# Patient Record
Sex: Female | Born: 1985 | Race: Black or African American | Marital: Single | Smoking: Former smoker
Health system: Southern US, Community
[De-identification: ages and names within clinical notes are randomized; demographics above are authoritative.]

## PROBLEM LIST (undated history)

## (undated) HISTORY — PX: WISDOM TOOTH EXTRACTION: SHX21

---

## 2011-02-24 ENCOUNTER — Inpatient Hospital Stay (INDEPENDENT_AMBULATORY_CARE_PROVIDER_SITE_OTHER)
Admission: RE | Admit: 2011-02-24 | Discharge: 2011-02-24 | Disposition: A | Discharge status: Home / Self Care | Payer: BC Managed Care – PPO | Source: Ambulatory Visit | Attending: Family Medicine | Admitting: Family Medicine

## 2011-02-24 DIAGNOSIS — J020 Streptococcal pharyngitis: Secondary | ICD-10-CM

## 2011-02-24 LAB — POCT RAPID STREP A: Streptococcus, Group A Screen (Direct): POSITIVE — AB

## 2015-06-08 LAB — OB RESULTS CONSOLE HEPATITIS B SURFACE ANTIGEN: HEP B S AG: NEGATIVE

## 2015-06-08 LAB — OB RESULTS CONSOLE RPR: RPR: NONREACTIVE

## 2015-06-08 LAB — OB RESULTS CONSOLE GC/CHLAMYDIA
Chlamydia: NEGATIVE
Gonorrhea: NEGATIVE

## 2015-06-08 LAB — OB RESULTS CONSOLE HIV ANTIBODY (ROUTINE TESTING): HIV: NONREACTIVE

## 2015-06-08 LAB — OB RESULTS CONSOLE RUBELLA ANTIBODY, IGM: RUBELLA: IMMUNE

## 2015-09-11 LAB — OB RESULTS CONSOLE GBS: GBS: POSITIVE

## 2015-10-07 ENCOUNTER — Encounter (HOSPITAL_COMMUNITY): Payer: Self-pay | Admitting: *Deleted

## 2015-10-07 ENCOUNTER — Encounter (HOSPITAL_COMMUNITY)
Admission: AD | Disposition: A | Discharge status: Home / Self Care | Payer: Self-pay | Source: Ambulatory Visit | Attending: Obstetrics & Gynecology

## 2015-10-07 ENCOUNTER — Inpatient Hospital Stay (HOSPITAL_COMMUNITY): Payer: Medicaid Other | Admitting: Anesthesiology

## 2015-10-07 ENCOUNTER — Inpatient Hospital Stay (HOSPITAL_COMMUNITY): Payer: Medicaid Other

## 2015-10-07 ENCOUNTER — Inpatient Hospital Stay (HOSPITAL_COMMUNITY)
Admission: AD | Admit: 2015-10-07 | Discharge: 2015-10-10 | DRG: 766 | Disposition: A | Discharge status: Home / Self Care | Payer: Medicaid Other | Source: Ambulatory Visit | Attending: Obstetrics & Gynecology | Admitting: Obstetrics & Gynecology

## 2015-10-07 DIAGNOSIS — O9902 Anemia complicating childbirth: Secondary | ICD-10-CM | POA: Diagnosis present

## 2015-10-07 DIAGNOSIS — Z3A39 39 weeks gestation of pregnancy: Secondary | ICD-10-CM

## 2015-10-07 DIAGNOSIS — Z87891 Personal history of nicotine dependence: Secondary | ICD-10-CM

## 2015-10-07 DIAGNOSIS — D649 Anemia, unspecified: Secondary | ICD-10-CM | POA: Diagnosis present

## 2015-10-07 DIAGNOSIS — O99824 Streptococcus B carrier state complicating childbirth: Secondary | ICD-10-CM | POA: Diagnosis present

## 2015-10-07 DIAGNOSIS — O99019 Anemia complicating pregnancy, unspecified trimester: Secondary | ICD-10-CM | POA: Diagnosis present

## 2015-10-07 DIAGNOSIS — O43129 Velamentous insertion of umbilical cord, unspecified trimester: Secondary | ICD-10-CM | POA: Diagnosis present

## 2015-10-07 LAB — TYPE AND SCREEN
ABO/RH(D): AB POS
ANTIBODY SCREEN: NEGATIVE

## 2015-10-07 LAB — CBC
HEMATOCRIT: 35.4 % — AB (ref 36.0–46.0)
HEMOGLOBIN: 12.2 g/dL (ref 12.0–15.0)
MCH: 30.9 pg (ref 26.0–34.0)
MCHC: 34.5 g/dL (ref 30.0–36.0)
MCV: 89.6 fL (ref 78.0–100.0)
Platelets: 373 10*3/uL (ref 150–400)
RBC: 3.95 MIL/uL (ref 3.87–5.11)
RDW: 13.6 % (ref 11.5–15.5)
WBC: 16.5 10*3/uL — ABNORMAL HIGH (ref 4.0–10.5)

## 2015-10-07 LAB — ABO/RH: ABO/RH(D): AB POS

## 2015-10-07 SURGERY — Surgical Case
Anesthesia: Epidural

## 2015-10-07 MED ORDER — CITRIC ACID-SODIUM CITRATE 334-500 MG/5ML PO SOLN
30.0000 mL | ORAL | Status: DC | PRN
  Administered 2015-10-07: 30 mL via ORAL
  Filled 2015-10-07: qty 15

## 2015-10-07 MED ORDER — FENTANYL 2.5 MCG/ML BUPIVACAINE 1/10 % EPIDURAL INFUSION (WH - ANES)
14.0000 mL/h | INTRAMUSCULAR | Status: DC | PRN
  Administered 2015-10-07: 14 mL/h via EPIDURAL
  Filled 2015-10-07: qty 125

## 2015-10-07 MED ORDER — PHENYLEPHRINE 40 MCG/ML (10ML) SYRINGE FOR IV PUSH (FOR BLOOD PRESSURE SUPPORT)
80.0000 ug | PREFILLED_SYRINGE | INTRAVENOUS | Status: DC | PRN
  Filled 2015-10-07: qty 20

## 2015-10-07 MED ORDER — OXYTOCIN 10 UNIT/ML IJ SOLN
INTRAMUSCULAR | Status: AC
  Filled 2015-10-07: qty 4

## 2015-10-07 MED ORDER — DIPHENHYDRAMINE HCL 50 MG/ML IJ SOLN: 12.5000 mg | INTRAMUSCULAR | Status: DC | PRN

## 2015-10-07 MED ORDER — SCOPOLAMINE 1 MG/3DAYS TD PT72
MEDICATED_PATCH | TRANSDERMAL | Status: AC
  Filled 2015-10-07: qty 1

## 2015-10-07 MED ORDER — OXYTOCIN 40 UNITS IN LACTATED RINGERS INFUSION - SIMPLE MED
1.0000 m[IU]/min | INTRAVENOUS | Status: DC
  Administered 2015-10-07: 1 m[IU]/min via INTRAVENOUS

## 2015-10-07 MED ORDER — SCOPOLAMINE 1 MG/3DAYS TD PT72
MEDICATED_PATCH | TRANSDERMAL | Status: DC | PRN
  Administered 2015-10-07: 1 via TRANSDERMAL

## 2015-10-07 MED ORDER — OXYTOCIN 40 UNITS IN LACTATED RINGERS INFUSION - SIMPLE MED
INTRAVENOUS | Status: AC
  Filled 2015-10-07: qty 1000

## 2015-10-07 MED ORDER — MORPHINE SULFATE (PF) 0.5 MG/ML IJ SOLN
INTRAMUSCULAR | Status: AC
  Filled 2015-10-07: qty 10

## 2015-10-07 MED ORDER — PHENYLEPHRINE HCL 10 MG/ML IJ SOLN
INTRAMUSCULAR | Status: DC | PRN
  Administered 2015-10-07 (×2): 80 ug via INTRAVENOUS
  Administered 2015-10-07: 40 ug via INTRAVENOUS
  Administered 2015-10-07: 80 ug via INTRAVENOUS

## 2015-10-07 MED ORDER — PENICILLIN G POTASSIUM 5000000 UNITS IJ SOLR
2.5000 10*6.[IU] | INTRAVENOUS | Status: DC
  Administered 2015-10-07: 2.5 10*6.[IU] via INTRAVENOUS
  Filled 2015-10-07 (×3): qty 2.5

## 2015-10-07 MED ORDER — ACETAMINOPHEN 325 MG PO TABS: 650.0000 mg | ORAL_TABLET | ORAL | Status: DC | PRN

## 2015-10-07 MED ORDER — EPHEDRINE 5 MG/ML INJ: 10.0000 mg | INTRAVENOUS | Status: DC | PRN

## 2015-10-07 MED ORDER — OXYCODONE-ACETAMINOPHEN 5-325 MG PO TABS: 2.0000 | ORAL_TABLET | ORAL | Status: DC | PRN

## 2015-10-07 MED ORDER — ONDANSETRON HCL 4 MG/2ML IJ SOLN: 4.0000 mg | Freq: Four times a day (QID) | INTRAMUSCULAR | Status: DC | PRN

## 2015-10-07 MED ORDER — MEPERIDINE HCL 25 MG/ML IJ SOLN
INTRAMUSCULAR | Status: DC | PRN
  Administered 2015-10-07 (×2): 12.5 mg via INTRAVENOUS

## 2015-10-07 MED ORDER — NALBUPHINE HCL 10 MG/ML IJ SOLN
5.0000 mg | INTRAMUSCULAR | Status: DC | PRN
  Administered 2015-10-07: 5 mg via INTRAVENOUS
  Filled 2015-10-07: qty 1

## 2015-10-07 MED ORDER — OXYCODONE-ACETAMINOPHEN 5-325 MG PO TABS: 1.0000 | ORAL_TABLET | ORAL | Status: DC | PRN

## 2015-10-07 MED ORDER — TERBUTALINE SULFATE 1 MG/ML IJ SOLN
0.2500 mg | Freq: Once | INTRAMUSCULAR | Status: DC | PRN
Start: 2015-10-07 — End: 2015-10-08

## 2015-10-07 MED ORDER — CEFAZOLIN SODIUM-DEXTROSE 2-3 GM-% IV SOLR
2.0000 g | Freq: Once | INTRAVENOUS | Status: AC
  Administered 2015-10-07: 2 g via INTRAVENOUS

## 2015-10-07 MED ORDER — ONDANSETRON HCL 4 MG/2ML IJ SOLN
INTRAMUSCULAR | Status: AC
  Filled 2015-10-07: qty 2

## 2015-10-07 MED ORDER — OXYTOCIN 40 UNITS IN LACTATED RINGERS INFUSION - SIMPLE MED
1.0000 m[IU]/min | INTRAVENOUS | Status: DC
  Administered 2015-10-07: 2 m[IU]/min via INTRAVENOUS

## 2015-10-07 MED ORDER — LACTATED RINGERS IV SOLN
INTRAVENOUS | Status: DC | PRN
  Administered 2015-10-07: 22:00:00 via INTRAVENOUS

## 2015-10-07 MED ORDER — LACTATED RINGERS IV BOLUS (SEPSIS): 500.0000 mL | Freq: Once | INTRAVENOUS | Status: DC

## 2015-10-07 MED ORDER — LIDOCAINE-EPINEPHRINE 2 %-1:100000 IJ SOLN
INTRAMUSCULAR | Status: DC | PRN
  Administered 2015-10-07 (×2): 5 mL

## 2015-10-07 MED ORDER — LIDOCAINE HCL (PF) 1 % IJ SOLN
INTRAMUSCULAR | Status: DC | PRN
  Administered 2015-10-07 (×2): 4 mL via EPIDURAL

## 2015-10-07 MED ORDER — LIDOCAINE HCL (PF) 1 % IJ SOLN
30.0000 mL | INTRAMUSCULAR | Status: DC | PRN
Start: 2015-10-07 — End: 2015-10-08
  Filled 2015-10-07: qty 30

## 2015-10-07 MED ORDER — MORPHINE SULFATE (PF) 0.5 MG/ML IJ SOLN
INTRAMUSCULAR | Status: DC | PRN
  Administered 2015-10-07: .5 mg via EPIDURAL
  Administered 2015-10-07: 4 mg via EPIDURAL
  Administered 2015-10-07: .5 mg via INTRAVENOUS

## 2015-10-07 MED ORDER — OXYTOCIN 40 UNITS IN LACTATED RINGERS INFUSION - SIMPLE MED: 62.5000 mL/h | INTRAVENOUS | Status: DC

## 2015-10-07 MED ORDER — ONDANSETRON HCL 4 MG/2ML IJ SOLN
INTRAMUSCULAR | Status: DC | PRN
  Administered 2015-10-07: 4 mg via INTRAVENOUS

## 2015-10-07 MED ORDER — CEFAZOLIN SODIUM-DEXTROSE 2-3 GM-% IV SOLR
INTRAVENOUS | Status: AC
  Filled 2015-10-07: qty 50

## 2015-10-07 MED ORDER — LACTATED RINGERS IV SOLN
INTRAVENOUS | Status: DC
  Administered 2015-10-07: 15:00:00 via INTRAVENOUS

## 2015-10-07 MED ORDER — MEPERIDINE HCL 25 MG/ML IJ SOLN
INTRAMUSCULAR | Status: AC
  Filled 2015-10-07: qty 1

## 2015-10-07 MED ORDER — OXYTOCIN 10 UNIT/ML IJ SOLN
40.0000 [IU] | INTRAVENOUS | Status: DC | PRN
  Administered 2015-10-07: 40 [IU] via INTRAVENOUS

## 2015-10-07 MED ORDER — OXYTOCIN BOLUS FROM INFUSION: 500.0000 mL | INTRAVENOUS | Status: DC

## 2015-10-07 MED ORDER — LACTATED RINGERS IV SOLN: 500.0000 mL | INTRAVENOUS | Status: DC | PRN

## 2015-10-07 MED ORDER — PENICILLIN G POTASSIUM 5000000 UNITS IJ SOLR
5.0000 10*6.[IU] | Freq: Once | INTRAMUSCULAR | Status: AC
  Administered 2015-10-07: 5 10*6.[IU] via INTRAVENOUS
  Filled 2015-10-07: qty 5

## 2015-10-07 SURGICAL SUPPLY — 35 items
CLAMP CORD UMBIL (MISCELLANEOUS) IMPLANT
CLOTH BEACON ORANGE TIMEOUT ST (SAFETY) ×3 IMPLANT
DRAPE C SECTION CLR SCREEN (DRAPES) ×3 IMPLANT
DRAPE SHEET LG 3/4 BI-LAMINATE (DRAPES) IMPLANT
DRSG OPSITE POSTOP 4X10 (GAUZE/BANDAGES/DRESSINGS) ×3 IMPLANT
DURAPREP 26ML APPLICATOR (WOUND CARE) ×3 IMPLANT
ELECT REM PT RETURN 9FT ADLT (ELECTROSURGICAL) ×3
ELECTRODE REM PT RTRN 9FT ADLT (ELECTROSURGICAL) ×1 IMPLANT
EXTRACTOR VACUUM M CUP 4 TUBE (SUCTIONS) IMPLANT
EXTRACTOR VACUUM M CUP 4' TUBE (SUCTIONS)
GLOVE BIOGEL PI IND STRL 7.0 (GLOVE) ×2 IMPLANT
GLOVE BIOGEL PI INDICATOR 7.0 (GLOVE) ×4
GLOVE SURG SS PI 6.5 STRL IVOR (GLOVE) ×3 IMPLANT
GOWN STRL REUS W/TWL LRG LVL3 (GOWN DISPOSABLE) ×6 IMPLANT
KIT ABG SYR 3ML LUER SLIP (SYRINGE) IMPLANT
LIQUID BAND (GAUZE/BANDAGES/DRESSINGS) IMPLANT
NEEDLE HYPO 25X5/8 SAFETYGLIDE (NEEDLE) IMPLANT
NS IRRIG 1000ML POUR BTL (IV SOLUTION) ×3 IMPLANT
PACK C SECTION WH (CUSTOM PROCEDURE TRAY) ×3 IMPLANT
PAD OB MATERNITY 4.3X12.25 (PERSONAL CARE ITEMS) ×3 IMPLANT
PENCIL SMOKE EVAC W/HOLSTER (ELECTROSURGICAL) ×3 IMPLANT
RTRCTR C-SECT PINK 25CM LRG (MISCELLANEOUS) ×3 IMPLANT
SUT CHROMIC 1 CTX 36 (SUTURE) IMPLANT
SUT CHROMIC 2 0 CT 1 (SUTURE) ×3 IMPLANT
SUT MON AB 4-0 PS1 27 (SUTURE) ×3 IMPLANT
SUT PLAIN 1 NONE 54 (SUTURE) IMPLANT
SUT PLAIN 2 0 (SUTURE)
SUT PLAIN 2 0 XLH (SUTURE) IMPLANT
SUT PLAIN ABS 2-0 CT1 27XMFL (SUTURE) IMPLANT
SUT VIC AB 0 CTX 36 (SUTURE) ×2
SUT VIC AB 0 CTX36XBRD ANBCTRL (SUTURE) ×1 IMPLANT
SUT VIC AB 1 CTX 36 (SUTURE) ×4
SUT VIC AB 1 CTX36XBRD ANBCTRL (SUTURE) ×2 IMPLANT
TOWEL OR 17X24 6PK STRL BLUE (TOWEL DISPOSABLE) ×3 IMPLANT
TRAY FOLEY CATH SILVER 14FR (SET/KITS/TRAYS/PACK) ×3 IMPLANT

## 2015-10-07 NOTE — MAU Provider Note (Signed)
Diamond Delacruz is a 30 y.o. G1P0 at 39.2 weeks c/o ctx since this morning.  Denies vb or lof w/+FM   History     There are no active problems to display for this patient.   Chief Complaint  Patient presents with  . Labor Eval   HPI  OB History    Gravida Para Term Preterm AB TAB SAB Ectopic Multiple Living   1               No past medical history on file.  No past surgical history on file.  No family history on file.  Social History  Substance Use Topics  . Smoking status: Not on file  . Smokeless tobacco: Not on file  . Alcohol Use: Not on file    Allergies: Allergies not on file  No prescriptions prior to admission    ROS See HPI above, all other systems are negative  Physical Exam   Blood pressure 110/69, pulse 92, temperature 98.3 F (36.8 C), temperature source Oral, resp. rate 20.  Physical Exam Ext:  WNL ABD: Soft, non tender to palpation, no rebound or guarding SVE: 2-3/60/-3   ED Course  Assessment: IUP at  39.2weeks Membranes: itact FHR: Category 2, 145, moderate variability occasional accel early decels, 1 variable decel CTX:  5-7 minutes   Plan: OB US IV hydration    Oyuki Hogan, CNM, MSN 10/07/2015. 12:09 PM  BPP 2/8 Admit to L&D for IOL Dr Sallye OberKulwa will be informed

## 2015-10-07 NOTE — Progress Notes (Signed)
Addendum  Foley bulb dislodged at 1530 VE 4/90/-3

## 2015-10-07 NOTE — Anesthesia Procedure Notes (Signed)
Epidural Patient location during procedure: OB Start time: 10/07/2015 8:12 PM End time: 10/07/2015 8:19 PM  Staffing Anesthesiologist: Shona SimpsonHOLLIS, Terecia Plaut D Performed by: anesthesiologist   Preanesthetic Checklist Completed: patient identified, site marked, surgical consent, pre-op evaluation, timeout performed, IV checked, risks and benefits discussed and monitors and equipment checked  Epidural Patient position: sitting Prep: ChloraPrep Patient monitoring: heart rate, continuous pulse ox and blood pressure Approach: midline Location: L3-L4 Injection technique: LOR saline  Needle:  Needle type: Tuohy  Needle gauge: 17 G Needle length: 9 cm Catheter type: closed end flexible Catheter size: 20 Guage Test dose: negative and 1.5% lidocaine  Assessment Events: blood not aspirated, injection not painful, no injection resistance and no paresthesia  Additional Notes LOR @ 6  Patient identified. Risks/Benefits/Options discussed with patient including but not limited to bleeding, infection, nerve damage, paralysis, failed block, incomplete pain control, headache, blood pressure changes, nausea, vomiting, reactions to medications, itching and postpartum back pain. Confirmed with bedside nurse the patient's most recent platelet count. Confirmed with patient that they are not currently taking any anticoagulation, have any bleeding history or any family history of bleeding disorders. Patient expressed understanding and wished to proceed. All questions were answered. Sterile technique was used throughout the entire procedure. Please see nursing notes for vital signs. Test dose was given through epidural catheter and negative prior to continuing to dose epidural or start infusion. Warning signs of high block given to the patient including shortness of breath, tingling/numbness in hands, complete motor block, or any concerning symptoms with instructions to call for help. Patient was given instructions on  fall risk and not to get out of bed. All questions and concerns addressed with instructions to call with any issues or inadequate analgesia.    Reason for block:procedure for pain

## 2015-10-07 NOTE — Op Note (Signed)
Patient: Diamond Delacruz, Diamond Delacruz  DOB: [birth year]  MRN: [id number]  DATE OF SURGERY: 10/07/2015    PREOP DIAGNOSIS:  1. 39 week 2 day  EGA intrauterine pregnancy.  2. Induction of labor secondary to BPP of 4/10.  3. Abnormal fetal heart tracing -persistent category II tracing despite intrauterine resuscitation. 4. Remote from delivery at 4cm dilation.  5. Velamentous chord insertion.   POSTOP DIAGNOSIS: Same as above.  PROCEDURE: Primary low uterine segment transverse cesarean section via Pfannenstiel incision.     SURGEON: Dr.  Hoover Browns  ASSISTANT: Sherre Scarlet, CNM  ANESTHESIA: Epidural  COMPLICATIONS: None  FINDINGS: Viable female infant in cephalic presentation, DOA, Apgar scores of 8 and 9. Normal uterus and fallopian tubes and ovaries bilaterally.    EBL: 800 cc  IV FLUID: 1300 cc LR   URINE OUTPUT: 175cc cc concentrated urine  INDICATIONS:  30 y/o P0 who was induced for BPP 4/10.  Abnormal fetal heart tracing with variable and late deceleratios were noted.  Category II tracing remained persistent despite intrauterine resuscitation.  As patient was remote from delivery at 4cm a primary cesarean section was performed. She was consented for the procedure after explaining risks benefits and alternatives of the procedure.    PROCEDURE:   Informed consent was obtained from the patient to undergo the procedure. She was taken to the operating room where her epidural anesthesia was found to be adequate. She was prepped and draped in the usual sterile fashion and a Foley catheter was placed. She received 2 g of IV Ancef preoperatively. A Pfannenstiel incision was made with the scalpel and the incision extended through the subcutaneous layer and also the fascia with the bovie. Small perforators in the subcutaneous layer were contained with the Bovie. The fascia was nicked in the midline and then was further separated from the rectus muscles bilaterally using Mayo scissors. Kochers  were placed inferiorly and then superiorly to allow further separation of fascia from the rectus muscles.  The peritoneal cavity was entered bluntly with the fingers. The Alexis retractor was placed in. The bladder flap was created using Metzenbaum scissors.   The uterus was incised with a scalpel and the incision extended bluntly bilaterally with fingers. Light meconium stained amniotic fluid was noted.  The head then the rest of the body was then delivered with abdominal pressure.  She delivered a viable female infant, apgar scores 8,9.  The cord was clamped and cut. Cord PH and cord blood was collected.    The uterus was not exteriorized.  The placenta was delivered with gentle traction on the umbilical cord, velamentous insertion was noted. The edges of the uterus was grasped with Allis clamps and also T. Clamps. The uterus was cleared of clots and debris with a lap.  The incision uterine incision was closed with #1 Vicryl in a running locked stitch. An imbricating layer of came stitch was placed over the initial closure.  A small area that bled on the right side was contained with figure of 8 stitch.  Excellent hemostasis was noted over the incision.  The muscles were then reapproximated using chromic suture.  Fascia was closed using 0 Vicryl in a running stitch. The subcutaneous layer was irrigated and suctioned out. Small perforators were contained with the bovie.  The subcutaneous was closed over using 1-0 plain in interrupted stitches. The skin was closed using 4-0 Monocryl. Dermabond was applied. Honeycomb was then applied. The patient was then cleaned and she was taken to the recovery room  in stable condition. The neonate was also taken to the recovery room with mother in stable condition.   SPECIMEN: Placenta, umbilical cord pH, blood  DISPOSITION: TO PACU, STABLE.

## 2015-10-07 NOTE — Progress Notes (Signed)
Labor Progress  Subjective: Pt has asked for IV pain meds, want to delay having a epidural  Objective: BP 116/75 mmHg  Pulse 90  Temp(Src) 99 F (37.2 C) (Oral)  Resp 20  Ht 5\' 2"  (1.575 m)  Wt 187 lb (84.823 kg)  BMI 34.19 kg/m2     FHT: 125, moderate variability, + accel, occasional variable decel CTX:  irregular, every 4-6 minutes Uterus gravid, soft non tender SVE:  Dilation: 4 Effacement (%): 90 Station: -3 Exam by:: Doctor, hospitalmith RN Pitocin at 642mUn/min  Assessment:  IUP at 39.2 weeks NICHD: Category 2 Membranes:  intact Labor progress: IOL Pitocin Augmentation GBS: positive   Plan: Continue labor plan Continuous monitoring Frequent position changes to facilitate fetal rotation and descent. Continue pitocin per protocol      Diamond Delacruz, CNM, MSN 10/07/2015. 6:47 PM

## 2015-10-07 NOTE — Transfer of Care (Signed)
Immediate Anesthesia Transfer of Care Note  Patient: Diamond Delacruz  Procedure(s) Performed: Procedure(s): CESAREAN SECTION (N/A)  Patient Location: PACU  Anesthesia Type:Epidural  Level of Consciousness: awake, alert  and oriented  Airway & Oxygen Therapy: Patient Spontanous Breathing  Post-op Assessment: Report given to RN and Post -op Vital signs reviewed and stable  Post vital signs: Reviewed and stable  Last Vitals:  Filed Vitals:   10/07/15 2037 10/07/15 2149  BP: 108/53   Pulse: 86   Temp:  37.1 C  Resp: 17     Complications: No apparent anesthesia complications

## 2015-10-07 NOTE — Progress Notes (Addendum)
Received report and assumed care of Diamond Delacruz, 30 yo G1P0 @ 39.2 wks admitted for induction of labor due to non reassuring fetal testing. U/S in room for repeat BPP. Family at bedside.    Subjective: Not quite comfortable w/ epidural yet. +FM. No leaking or bleeding.  Objective: BP 115/72 mmHg  Pulse 99  Temp(Src) 99 F (37.2 C) (Oral)  Resp 20  Ht 5\' 2"  (1.575 m)  Wt 84.823 kg (187 lb)  BMI 34.19 kg/m2   Today's Vitals   10/07/15 1831 10/07/15 1842 10/07/15 1900 10/07/15 1950  BP: 88/70 116/75 115/72   Pulse: 89 90 99   Temp:      TempSrc:      Resp:   20   Height:      Weight:      PainSc: 7   7  10-Worst pain ever    FHT: BL 140 w/ moderate variability, +accels. 1900: Early decel, BL FHR 120 1916: Variable decel to nadir 110 x 25 secs, BL FHR 125 1925: Variable decel to nadir 70 x 30 secs 2030: Variable decel to nadir 90s x 50 secs 2033: Variable decel to nadir 80s x 50 secs 2039: Late decel to nadir 90s x 50 secs 2047: Variable decel to nadir 120 x 30 secs UC: every 2 minutes initially, then every 2-5 minutes after Pitocin d/c'd SVE: 4/70/-2 Epidural placed at 20:12 PM Pitocin at 4 mU/min, off at ~ 20:30 PM IVF bolus at ~ 20:30 PM 02 at ~ 20:30 PM AROM'd at ~ 20:45 PM, small amount of blood tinged fluid - not mec stained in appearance Full internals placed Amnioinfusion begun at 20:57  BPP preliminary report as follows:  Completed at 20:13 PM  FHR 128 bpm; cephalic presentation; AFI 10.42 cm (32nd%tile; largest pocket = 4.07 cm). BPP 4/8 (-breathing & -tone)  Assessment:  IOL due to non reassuring fetal testing at term Cat 2 FHRT  GBS positive; adequately treated Latent labor  Plan: Observe closely Restart Pitocin in 1 hr provided Cat 1 tracing Continue intrauterine resuscitative measures Update Dr. Alois ClicheKulwa  WILLIAMS, [facility] Center[facility]IMBER[facility] CNM 10/07/2015, 9:07 PM  ADDENDUM: Continues to have intermittent mod-severe variables despite above measures. Ctxs Q  5 min. Discussed w/ Dr. Sallye OberKulwa. Will proceed w/ c-section due to NRFHRT/remote from delivery. Risks, Benefits, Alternatives including but not limited to bleeding, infection and injury were discussed with the patient. Patient verbalized understanding. Consent signed and witnessed. House coverage supervisor and anesthesia notified. Ancef 2 grams on call to OR.  Sherre ScarletKimberly Williams, CNM 10/07/15, 9:58 PM  I evaluated patient at bedside.  With persistent category II tracing  (variable and late decelerations) despite intrauterine resuscitation and remote from delivery therefore will proceed with cesarean delivery.  I discussed with patient and her family indications for cesarean delivery as well as the risks, benefits and alternatives of cesarean delivery including risks of bleeding, infection, damage to organs.  All questions were answered and consents signed.  Dr. Sallye OberKulwa.  10/07/15 10:19 PM.

## 2015-10-07 NOTE — Anesthesia Preprocedure Evaluation (Signed)
Anesthesia Evaluation  Patient identified by MRN, date of birth, ID band Patient awake    Reviewed: Allergy & Precautions, NPO status , Patient's Chart, lab work & pertinent test results  Airway Mallampati: II  TM Distance: >3 FB Neck ROM: Full    Dental  (+) Teeth Intact   Pulmonary former smoker,    breath sounds clear to auscultation       Cardiovascular negative cardio ROS   Rhythm:Regular Rate:Normal     Neuro/Psych negative neurological ROS  negative psych ROS   GI/Hepatic negative GI ROS, Neg liver ROS,   Endo/Other  negative endocrine ROS  Renal/GU negative Renal ROS  negative genitourinary   Musculoskeletal negative musculoskeletal ROS (+)   Abdominal   Peds negative pediatric ROS (+)  Hematology negative hematology ROS (+)   Anesthesia Other Findings   Reproductive/Obstetrics (+) Pregnancy                             Lab Results  Component Value Date   WBC 16.5* 10/07/2015   HGB 12.2 10/07/2015   HCT 35.4* 10/07/2015   MCV 89.6 10/07/2015   PLT 373 10/07/2015   No results found for: INR, PROTIME   Anesthesia Physical Anesthesia Plan  ASA: II  Anesthesia Plan: Epidural   Post-op Pain Management:    Induction:   Airway Management Planned:   Additional Equipment:   Intra-op Plan:   Post-operative Plan:   Informed Consent: I have reviewed the patients History and Physical, chart, labs and discussed the procedure including the risks, benefits and alternatives for the proposed anesthesia with the patient or authorized representative who has indicated his/her understanding and acceptance.     Plan Discussed with:   Anesthesia Plan Comments:         Anesthesia Quick Evaluation

## 2015-10-07 NOTE — MAU Note (Signed)
Pt C/O uc's since 0500 this a.m., now 1-3 minutes apart.  Denies bleeding or LOF.

## 2015-10-07 NOTE — H&P (Signed)
Diamond Delacruz is a 30 y.o. female, G1 P0 at 39.2 weeks.  [place] BPP 2/8, AFI 10.4, FHR 144  Patient Active Problem List   Diagnosis Date Noted  . Normal vaginal delivery 10/07/2015    Pregnancy Course: Patient transferred care at 24.4 weeks.   EDC of 10/12/15 was established by LMP.      [place] evaluations:   18.4 weeks - Anatomy: fhr 159, posterior lowlying placenta above the oz, velamentous cord insertion, efw 5oz - 53%,      30.1 weeks - FU: Vertex presentation. Posterior placenta. Placental edge is 5.5 cm from internal OS, Normal.  Cx appears  closed. Fluid is WNL.EFW: 1390 grams= 47%tile. Velamentous Cord insertion at lateral edge of placenta. Cervical length 3.2,  38.2 weeks - EFW 7lb 7oz - 77%, fhr 160,  AFI 13.78, posterior placenta,   Significant prenatal events:   Hx tric and chlamydia, Hx Velamentous cord, lo lying plaenta - resolved, anemia, SP high risk msQuad,  no marker associated with aneuploidly Last evaluation:   38.5 weeks   VE:1/50/-3 on12/28/16  Reason for admission:  IOL for BPP 2/8  Pt States:   Contractions Frequency: 4-5         Contraction severity: strong         Fetal activity: +FM  OB History    Gravida Para Term Preterm AB TAB SAB Ectopic Multiple Living   1              History reviewed. No pertinent past medical history. Past Surgical History  Procedure Laterality Date  . Wisdom tooth extraction     Family History: family history is not on file. Social History:  reports that she has quit smoking. She has never used smokeless tobacco. She reports that she does not drink alcohol or use illicit drugs.   Prenatal Transfer Tool  Maternal Diabetes: No Genetic Screening: Abnormal:  Results: Other:re-eval low risk Maternal Ultrasounds/Referrals: Normal Fetal Ultrasounds or other Referrals:  None Maternal Substance Abuse:  No Significant Maternal Medications:  None Significant Maternal Lab Results: Lab values include: Group B Strep positive   ROS:  See  HPI above, all other systems are negative  No Known Allergies  Dilation: 2.5 Effacement (%): 80 Station: -3 Exam by:: V. Sharai Overbay Blood pressure 107/93, pulse 98, temperature 99 F (37.2 C), temperature source Oral, resp. rate 20, height 5\' 2"  (1.575 m), weight 187 lb (84.823 kg).  Maternal Exam:  Uterine Assessment: Contraction frequency is rare.  Abdomen: Gravid, non tender. Fundal height is aga.  Normal external genitalia, vulva, cervix, uterus and adnexa.  No lesions noted on exam.  Pelvis adequate for delivery.  Fetal presentation: Vertex by VE  Fetal Exam:  Monitor Surveillance : Continuous Monitoring Mode: Ultrasound.  NICHD: Category 2 CTXs: Q 4-40minutes EFW   8 lbs  Physical Exam: Nursing note and vitals reviewed General: alert and cooperative She appears well nourished Psychiatric: Normal mood and affect. Her behavior is normal Head: Normocephalic Eyes: Pupils are equal, round, and reactive to light Neck: Normal range of motion Cardiovascular: RRR without murmur  Respiratory: CTAB. Effort normal  Abd: soft, non-tender, +BS, no rebound, no guarding  Genitourinary: Vagina normal  Neurological: A&Ox3 Skin: Warm and dry  Musculoskeletal: Normal range of motion  Homan's sign negative bilaterally No evidence of DVTs.  Edema: Minimal bilaterally non-pitting edema DTR: 2+ Clonus: None   Prenatal labs: ABO, Rh: --/--/AB POS (01/01 1255) Antibody: NEG (01/01 1255) Rubella:  Immune RPR:   NR  HBsAg:   negative HIV:   NR GBS:  positive Sickle cell/Hgb electrophoresis:  WNL Pap:   GC: negative  Chlamydia: negative Genetic screenings:   Glucola: negative  Assessment:  IUP at 39.2 weeks NICHD: Category 2 on admission Membranes: intact Bishop Score: 1 GBS positive IOL options reviewed with patient including foley bulb, AROM, and pitocin R&B of IOL reviewed including serial induction, failure, and/or CS requirement Pain management options reviewed Pt  and family verbalize understanding, agrees with treatment plan  and wishes to proceed with induction process  Plan:  Admit to L&D for IOL d/t BPP 2/8 Start induction with foley bulb  GBS prophylaxis with PCN G per Timofey Carandang dosing with onset of active labor.  Okay to ambulate around unit with wireless monitors  Okay to get up and shower without monitoring  Regular diet prior to starting pitocin Clear/Thin diet after pitocin starts Will reassess at 1800 and place next dose cytotec or foley bulb  Continue with labor mgmt as ordered IV pain medication per orders PRN Epidural per patient request Foley cath after patient is comfortable with epidural Anticipate SVD    Attending MD available at all times.   Fenris Cauble, CNM, MSN 10/07/2015, 3:16 PM

## 2015-10-07 NOTE — Brief Op Note (Addendum)
10/07/2015  10:32 PM  PATIENT:  Diamond Delacruz  30 y.o. female  PRE-OPERATIVE DIAGNOSIS:  1. 39 week 2 day EGA intrauterine pregnancy.  2.  Induction of labor secondary to BPP of 4/10.   3. Abnormal fetal heart tracing -persistent category II tracing despite intrauterine resuscitation. 4. Remote from delivery at 4cm dilation.  5. Velamentous chord insertion.    POST-OPERATIVE DIAGNOSIS:  Same as above.   PROCEDURE:  Procedure(s): CESAREAN SECTION (N/A) Primary  SURGEON:  Surgeon(s) and Role:    * Hoover BrownsEma Jacqualine Weichel, MD - Primary  ASSISTANTS: Sherre ScarletKimberly Williams, CNM.    ANESTHESIA:   epidural  EBL:  Total I/O In: -  Out: 175 [Urine:175] In: 1300cc LR EBL: 800cc  BLOOD ADMINISTERED:none  DRAINS: none   LOCAL MEDICATIONS USED:  NONE  SPECIMEN: Placenta.  Cord PH.    DISPOSITION OF SPECIMEN:  PATHOLOGY, lab respectively.   COUNTS:  YES  TOURNIQUET:  * No tourniquets in log *  DICTATION: .Dragon Dictation  PLAN OF CARE: Admit to inpatient   PATIENT DISPOSITION:  PACU - hemodynamically stable.   Delay start of Pharmacological VTE agent (>24hrs) due to surgical blood loss or risk of bleeding: not applicable

## 2015-10-07 NOTE — Consult Note (Signed)
[facility]  Delivery Note:  C-section       10/07/2015  10:50 PM  I was called to the operating room at the request of the patient's obstetrician (Dr. Sallye OberKulwa) for a primary c-section.  PRENATAL HX:  30 y/o G1P0 admitted today at 3839 and 2/7 weeks for IOL due to BPP 2/8.  She is GBS positive and received adequate treatment.  Delivery then by c-section for variable decelerations during labor.  Her pregnancy has been complicated by history of chlamydia, velamentous cord.    DELIVERY:  Infant was vigorous at delivery, requiring no resuscitation other than standard warming, drying and stimulation.  APGARs 8 and 9.  Exam notable for slight meconium staining, otherwise was within normal limits.  After 5 minutes, baby left with nurse to assist parents with skin-to-skin care.   _____________________ Electronically Signed By: Maryan CharLindsey Monicka Cyran, MD Neonatologist

## 2015-10-08 ENCOUNTER — Encounter (HOSPITAL_COMMUNITY): Payer: Self-pay | Admitting: *Deleted

## 2015-10-08 LAB — CBC
HCT: 29.8 % — ABNORMAL LOW (ref 36.0–46.0)
HEMOGLOBIN: 9.9 g/dL — AB (ref 12.0–15.0)
MCH: 29.8 pg (ref 26.0–34.0)
MCHC: 33.2 g/dL (ref 30.0–36.0)
MCV: 89.8 fL (ref 78.0–100.0)
Platelets: 301 10*3/uL (ref 150–400)
RBC: 3.32 MIL/uL — AB (ref 3.87–5.11)
RDW: 13.7 % (ref 11.5–15.5)
WBC: 22.1 10*3/uL — ABNORMAL HIGH (ref 4.0–10.5)

## 2015-10-08 LAB — RPR: RPR: NONREACTIVE

## 2015-10-08 MED ORDER — DIPHENHYDRAMINE HCL 25 MG PO CAPS
25.0000 mg | ORAL_CAPSULE | Freq: Four times a day (QID) | ORAL | Status: DC | PRN
  Filled 2015-10-08: qty 1

## 2015-10-08 MED ORDER — NALBUPHINE HCL 10 MG/ML IJ SOLN: 5.0000 mg | Freq: Once | INTRAMUSCULAR | Status: DC | PRN

## 2015-10-08 MED ORDER — LANOLIN HYDROUS EX OINT
1.0000 | TOPICAL_OINTMENT | CUTANEOUS | Status: DC | PRN
Start: 2015-10-08 — End: 2015-10-10

## 2015-10-08 MED ORDER — ACETAMINOPHEN 325 MG PO TABS: 650.0000 mg | ORAL_TABLET | ORAL | Status: DC | PRN

## 2015-10-08 MED ORDER — DIPHENHYDRAMINE HCL 50 MG/ML IJ SOLN: 12.5000 mg | INTRAMUSCULAR | Status: DC | PRN

## 2015-10-08 MED ORDER — NALBUPHINE HCL 10 MG/ML IJ SOLN: 5.0000 mg | INTRAMUSCULAR | Status: DC | PRN

## 2015-10-08 MED ORDER — ZOLPIDEM TARTRATE 5 MG PO TABS: 5.0000 mg | ORAL_TABLET | Freq: Every evening | ORAL | Status: DC | PRN

## 2015-10-08 MED ORDER — LACTATED RINGERS IV SOLN: INTRAVENOUS | Status: DC

## 2015-10-08 MED ORDER — PRENATAL MULTIVITAMIN CH
1.0000 | ORAL_TABLET | Freq: Every day | ORAL | Status: DC
  Administered 2015-10-08 – 2015-10-10 (×3): 1 via ORAL
  Filled 2015-10-08 (×3): qty 1

## 2015-10-08 MED ORDER — LACTATED RINGERS IV SOLN
INTRAVENOUS | Status: DC
  Administered 2015-10-08: 08:00:00 via INTRAVENOUS

## 2015-10-08 MED ORDER — MEPERIDINE HCL 25 MG/ML IJ SOLN: 6.2500 mg | INTRAMUSCULAR | Status: DC | PRN

## 2015-10-08 MED ORDER — NALBUPHINE HCL 10 MG/ML IJ SOLN
5.0000 mg | INTRAMUSCULAR | Status: DC | PRN
  Administered 2015-10-08: 5 mg via INTRAVENOUS
  Filled 2015-10-08: qty 1

## 2015-10-08 MED ORDER — NALOXONE HCL 2 MG/2ML IJ SOSY
1.0000 ug/kg/h | PREFILLED_SYRINGE | INTRAVENOUS | Status: DC | PRN
  Filled 2015-10-08: qty 2

## 2015-10-08 MED ORDER — DIPHENHYDRAMINE HCL 25 MG PO CAPS
25.0000 mg | ORAL_CAPSULE | ORAL | Status: DC | PRN
  Administered 2015-10-08: 25 mg via ORAL

## 2015-10-08 MED ORDER — SENNOSIDES-DOCUSATE SODIUM 8.6-50 MG PO TABS
2.0000 | ORAL_TABLET | ORAL | Status: DC
  Administered 2015-10-09 (×2): 2 via ORAL
  Filled 2015-10-08 (×2): qty 2

## 2015-10-08 MED ORDER — SCOPOLAMINE 1 MG/3DAYS TD PT72
1.0000 | MEDICATED_PATCH | Freq: Once | TRANSDERMAL | Status: DC
  Filled 2015-10-08: qty 1

## 2015-10-08 MED ORDER — KETOROLAC TROMETHAMINE 30 MG/ML IJ SOLN: 30.0000 mg | Freq: Four times a day (QID) | INTRAMUSCULAR | Status: AC | PRN

## 2015-10-08 MED ORDER — WITCH HAZEL-GLYCERIN EX PADS: 1.0000 "application " | MEDICATED_PAD | CUTANEOUS | Status: DC | PRN

## 2015-10-08 MED ORDER — SIMETHICONE 80 MG PO CHEW: 80.0000 mg | CHEWABLE_TABLET | ORAL | Status: DC | PRN

## 2015-10-08 MED ORDER — SODIUM CHLORIDE 0.9 % IJ SOLN: 3.0000 mL | INTRAMUSCULAR | Status: DC | PRN

## 2015-10-08 MED ORDER — PROMETHAZINE HCL 25 MG/ML IJ SOLN
6.2500 mg | INTRAMUSCULAR | Status: DC | PRN
Start: 2015-10-08 — End: 2015-10-08

## 2015-10-08 MED ORDER — IBUPROFEN 600 MG PO TABS
600.0000 mg | ORAL_TABLET | Freq: Four times a day (QID) | ORAL | Status: DC
  Administered 2015-10-08 – 2015-10-10 (×10): 600 mg via ORAL
  Filled 2015-10-08 (×11): qty 1

## 2015-10-08 MED ORDER — OXYCODONE-ACETAMINOPHEN 5-325 MG PO TABS: 1.0000 | ORAL_TABLET | ORAL | Status: DC | PRN

## 2015-10-08 MED ORDER — DIBUCAINE 1 % RE OINT: 1.0000 "application " | TOPICAL_OINTMENT | RECTAL | Status: DC | PRN

## 2015-10-08 MED ORDER — OXYCODONE-ACETAMINOPHEN 5-325 MG PO TABS: 2.0000 | ORAL_TABLET | ORAL | Status: DC | PRN

## 2015-10-08 MED ORDER — ONDANSETRON HCL 4 MG/2ML IJ SOLN: 4.0000 mg | Freq: Three times a day (TID) | INTRAMUSCULAR | Status: DC | PRN

## 2015-10-08 MED ORDER — FENTANYL CITRATE (PF) 100 MCG/2ML IJ SOLN: 25.0000 ug | INTRAMUSCULAR | Status: DC | PRN

## 2015-10-08 MED ORDER — OXYTOCIN 40 UNITS IN LACTATED RINGERS INFUSION - SIMPLE MED: 62.5000 mL/h | INTRAVENOUS | Status: AC

## 2015-10-08 MED ORDER — MENTHOL 3 MG MT LOZG: 1.0000 | LOZENGE | OROMUCOSAL | Status: DC | PRN

## 2015-10-08 MED ORDER — NALOXONE HCL 0.4 MG/ML IJ SOLN: 0.4000 mg | INTRAMUSCULAR | Status: DC | PRN

## 2015-10-08 NOTE — Progress Notes (Signed)
Subjective: Postpartum Day 1: Cesarean Delivery due to NRFHT Patient up ad lib, reports no syncope or dizziness. Ambulating in room Eating regular diet with no problem Feeding:  breast Contraceptive plan:  No discussed  Objective: Vital signs in last 24 hours: Temp:  [98.1 F (36.7 C)-100.1 F (37.8 C)] 98.8 F (37.1 C) (01/02 1300) Pulse Rate:  [75-104] 83 (01/02 1200) Resp:  [15-20] 20 (01/02 1200) BP: (88-131)/(44-83) 93/54 mmHg (01/02 1200) SpO2:  [94 %-100 %] 94 % (01/02 1200)  Physical Exam:  General: alert and cooperative Lochia: appropriate Uterine Fundus: firm Abdomen:  + bowel sounds, non distended Incision: healing well  Honeycomb dressing CDI DVT Evaluation: No evidence of DVT seen on physical exam. Homan's sign: Negative   Recent Labs  10/07/15 1255 10/08/15 0620  HGB 12.2 9.9*  HCT 35.4* 29.8*  WBC 16.5* 22.1*   Orthostatics stable.  Assessment: Status post Cesarean section day 1. Doing well postoperatively.  Honeycomb dressing in place, no significant drainage Anemia - hemodynamicly stable.   Circumcision: in patient   Plan: Continue current care. Breastfeeding and Lactation consult     Media Pizzini, CNM, MSN 10/08/2015. 2:59 PM

## 2015-10-08 NOTE — Anesthesia Postprocedure Evaluation (Signed)
Anesthesia Post Note  Patient: Diamond Delacruz  Procedure(s) Performed: Procedure(s) (LRB): CESAREAN SECTION (N/A)  Patient location during evaluation: Mother Baby Anesthesia Type: Epidural Level of consciousness: awake Pain management: pain level controlled Vital Signs Assessment: post-procedure vital signs reviewed and stable Respiratory status: spontaneous breathing Cardiovascular status: stable Postop Assessment: no headache, no backache, epidural receding, patient able to bend at knees, no signs of nausea or vomiting and adequate PO intake Anesthetic complications: no    Last Vitals:  Filed Vitals:   10/08/15 0300 10/08/15 0408  BP: 96/48   Pulse: 82   Temp: 36.8 C 37.2 C  Resp: 18 18    Last Pain:  Filed Vitals:   10/08/15 0709  PainSc: 0-No pain                 Rondal Vandevelde

## 2015-10-08 NOTE — Anesthesia Postprocedure Evaluation (Signed)
Anesthesia Post Note  Patient: Diamond Delacruz  Procedure(s) Performed: Procedure(s) (LRB): CESAREAN SECTION (N/A)  Patient location during evaluation: PACU Anesthesia Type: Epidural Level of consciousness: oriented and awake and alert Pain management: pain level controlled Vital Signs Assessment: post-procedure vital signs reviewed and stable Respiratory status: spontaneous breathing, respiratory function stable and patient connected to nasal cannula oxygen Cardiovascular status: blood pressure returned to baseline and stable Postop Assessment: no headache, no backache and epidural receding Anesthetic complications: no    Last Vitals:  Filed Vitals:   10/08/15 0012 10/08/15 0030  BP: 117/70 106/69  Pulse: 84 86  Temp:  36.8 C  Resp: 17 19    Last Pain:  Filed Vitals:   10/08/15 0034  PainSc: 0-No pain                 Shelton SilvasKevin D Hollis

## 2015-10-08 NOTE — Lactation Note (Signed)
This note was copied from the chart of Diamond Delacruz. Lactation Consultation Note  Initial visit made.  Breastfeeding consultation services and support information and reviewed.  Baby is 16 hours old and has nursed several times.  Mom feeling confident about latching and feeds.  Instructed to feed baby with any cue and to call out for assist prn.  Patient Name: Diamond Delacruz ZOXWR'UToday's Date: 10/08/2015 Reason for consult: Initial assessment   Maternal Data Formula Feeding for Exclusion: No Does the patient have breastfeeding experience prior to this delivery?: No  Feeding Feeding Type: Breast Fed Length of feed: 15 min  LATCH Score/Interventions                      Lactation Tools Discussed/Used     Consult Status Consult Status: Follow-up Date: 10/09/15 Follow-up type: In-patient    Huston FoleyMOULDEN, Artie Mcintyre S 10/08/2015, 3:48 PM

## 2015-10-08 NOTE — Progress Notes (Signed)
UR chart review completed.  

## 2015-10-08 NOTE — Addendum Note (Signed)
Addendum  created 10/08/15 0755 by Renford DillsJanet L Mitul Hallowell, CRNA   Modules edited: Clinical Notes   Clinical Notes:  File: [id number]

## 2015-10-09 ENCOUNTER — Encounter (HOSPITAL_COMMUNITY): Payer: Self-pay | Admitting: Obstetrics & Gynecology

## 2015-10-09 LAB — CBC WITH DIFFERENTIAL/PLATELET
BASOS PCT: 0 %
Basophils Absolute: 0 10*3/uL (ref 0.0–0.1)
EOS ABS: 0 10*3/uL (ref 0.0–0.7)
Eosinophils Relative: 0 %
HCT: 27.8 % — ABNORMAL LOW (ref 36.0–46.0)
HEMOGLOBIN: 9.3 g/dL — AB (ref 12.0–15.0)
Lymphocytes Relative: 17 %
Lymphs Abs: 2.9 10*3/uL (ref 0.7–4.0)
MCH: 30.2 pg (ref 26.0–34.0)
MCHC: 33.5 g/dL (ref 30.0–36.0)
MCV: 90.3 fL (ref 78.0–100.0)
MONO ABS: 1.2 10*3/uL — AB (ref 0.1–1.0)
Monocytes Relative: 7 %
NEUTROS ABS: 12.8 10*3/uL — AB (ref 1.7–7.7)
Neutrophils Relative %: 76 %
OTHER: 0 %
Platelets: 295 10*3/uL (ref 150–400)
RBC: 3.08 MIL/uL — ABNORMAL LOW (ref 3.87–5.11)
RDW: 13.9 % (ref 11.5–15.5)
WBC: 16.9 10*3/uL — ABNORMAL HIGH (ref 4.0–10.5)

## 2015-10-09 NOTE — Progress Notes (Signed)
Diamond Delacruz [id number]  Subjective: Postpartum Day 2: [facility] Patient up ad lib, reports no syncope or dizziness. Feeding:  Breast Contraceptive plan:  Considering options  Objective: Temp:  [98 F (36.7 C)-100.1 F (37.8 C)] 98 F (36.7 C) (01/03 0705) Pulse Rate:  [65-83] 79 (01/03 0705) Resp:  [18-20] 18 (01/03 0705) BP: (92-116)/(50-65) 96/50 mmHg (01/03 0705) SpO2:  [94 %-99 %] 99 % (01/02 2150)  CBC Latest Ref Rng 10/09/2015 10/08/2015 10/07/2015  WBC 4.0 - 10.5 K/uL 16.9(H) 22.1(H) 16.5(H)  Hemoglobin 12.0 - 15.0 g/dL 4.0(J9.3(L) 8.1(X9.9(L) 91.412.2  Hematocrit 36.0 - 46.0 % 27.8(L) 29.8(L) 35.4(L)  Platelets 150 - 400 K/uL 295 301 373     Physical Exam:  General: alert Lochia: appropriate Uterine Fundus: firm Abdomen:  + bowel sounds Incision: Honeycomb dressing CDI DVT Evaluation: No evidence of DVT seen on physical exam. Negative Homan's sign.   Assessment/Plan: Status post cesarean delivery, day 2. Stable Continue current care. Plan for discharge tomorrow  Reviewed contraceptive options.    Diamond BridgemanLATHAM, Diamond Simkin MSN, Diamond Delacruz 10/09/2015, 8:50 AM

## 2015-10-09 NOTE — Lactation Note (Signed)
This note was copied from the chart of Diamond Titianna Balinski. Lactation Consultation Note  Patient Name: Diamond Delacruz ZOXWR'UToday's Date: 10/09/2015 Reason for consult: Follow-up assessment  Follow up with mom of 37 hour old infant. Infant was awake and cueing to feed, mom latched infant independently, infant with rhythmic sucking and intermittent swallows. Infant with 9 Bf for 10-20 minutes, 1 attempt, 1 void and 3 stools in last 24 hours. Infant with LATCH Scores 8-9 by bedside RN. Mom reports that breast feel fuller today and denies nipple pain. Mom without questions at this time. We did discuss normalcy of cluster feeds. Enc mom to call with questions/concerns.  Maternal Data Formula Feeding for Exclusion: No Does the patient have breastfeeding experience prior to this delivery?: No  Feeding Feeding Type: Breast Fed Length of feed: 20 min  LATCH Score/Interventions Latch: Grasps breast easily, tongue down, lips flanged, rhythmical sucking.  Audible Swallowing: A few with stimulation Intervention(s): Skin to skin  Type of Nipple: Everted at rest and after stimulation  Comfort (Breast/Nipple): Soft / non-tender     Hold (Positioning): No assistance needed to correctly position infant at breast. Intervention(s): Breastfeeding basics reviewed;Support Pillows;Position options;Skin to skin  LATCH Score: 9  Lactation Tools Discussed/Used     Consult Status Consult Status: Follow-up Date: 10/10/15 Follow-up type: In-patient    Silas FloodSharon S Demetric Dunnaway 10/09/2015, 12:45 PM

## 2015-10-10 DIAGNOSIS — O99019 Anemia complicating pregnancy, unspecified trimester: Secondary | ICD-10-CM | POA: Diagnosis present

## 2015-10-10 MED ORDER — IBUPROFEN 600 MG PO TABS: 600.0000 mg | ORAL_TABLET | Freq: Four times a day (QID) | ORAL | Status: AC | PRN

## 2015-10-10 MED ORDER — OXYCODONE-ACETAMINOPHEN 5-325 MG PO TABS: 1.0000 | ORAL_TABLET | ORAL | Status: AC | PRN

## 2015-10-10 NOTE — Discharge Summary (Signed)
OB Discharge Summary     Patient Name: Diamond Delacruz DOB: [birth year] MRN: [id number]  Date of admission: 10/07/2015 Delivering MD: Sallye Ober, EMA   Date of discharge: 10/10/2015  Admitting diagnosis: contractions 1 to 3 mins apart 39 wks Intrauterine pregnancy: [redacted]w[redacted]d     Secondary diagnosis:  Active Problems:   Cesarean delivery delivered   Anemia affecting pregnancy  Additional problems: None     Discharge diagnosis: Term Pregnancy Delivered and fetal intolerance to labor, primary LTCS                                                                                                Post partum procedures:None  Augmentation: AROM and Pitocin  Complications: None  Hospital course:  Onset of Labor With Unplanned C/S  30 y.o. yo G1P1001 at [redacted]w[redacted]d was admitted for induction of labor due to [facility] 2/8 on 10/07/2015. Patient had a labor course significant for AROM and pitocin for induction, but fetus was intolerant of labor. Membrane Rupture Time/Date: 8:38 PM ,10/07/2015   The patient went for cesarean section due to Non-Reassuring FHR, and delivered a Viable infant,10/07/2015  Details of operation can be found in separate operative note. Patient had an uncomplicated postpartum course.  She is ambulating,tolerating a regular diet, passing flatus, and urinating well.  Patient is discharged home in stable condition 10/10/15.  On Fe supplement due to mild anemia, without orthostatic changes.  Physical exam  Filed Vitals:   10/08/15 2150 10/09/15 0705 10/09/15 1822 10/10/15 0624  BP: 116/65 96/50 104/63 96/56  Pulse: 65 79 82 79  Temp: 98.1 F (36.7 C) 98 F (36.7 C) 98.1 F (36.7 C) 98 F (36.7 C)  TempSrc: Oral Oral Oral Oral  Resp: 18 18 18 16   Height:      Weight:      SpO2: 99%      General: alert Lochia: appropriate Uterine Fundus: firm Incision: Healing well with no significant drainage, Honeycomb dressing CDI DVT Evaluation: No evidence of DVT seen on physical exam. Negative Homan's  sign. Labs: CBC Latest Ref Rng 10/09/2015 10/08/2015 10/07/2015  WBC 4.0 - 10.5 K/uL 16.9(H) 22.1(H) 16.5(H)  Hemoglobin 12.0 - 15.0 g/dL 3.0(Q) 6.5(H) 84.6  Hematocrit 36.0 - 46.0 % 27.8(L) 29.8(L) 35.4(L)  Platelets 150 - 400 K/uL 295 301 373    :No flowsheet data found.  Discharge instruction: per After Visit Summary and "Baby and Me Booklet".  After visit meds:    Medication List    TAKE these medications        ferrous sulfate 325 (65 FE) MG tablet  Take 325 mg by mouth daily with breakfast.     ibuprofen 600 MG tablet  Commonly known as:  ADVIL,MOTRIN  Take 1 tablet (600 mg total) by mouth every 6 (six) hours as needed.     oxyCODONE-acetaminophen 5-325 MG tablet  Commonly known as:  PERCOCET/ROXICET  Take 1 tablet by mouth every 4 (four) hours as needed (for pain scale 4-7).     prenatal multivitamin Tabs tablet  Take 1 tablet by mouth daily at 12 noon.  Diet: routine diet  Activity: Advance as tolerated. Pelvic rest for 6 weeks.   Outpatient follow up:6 weeks Follow up Appt:No future appointments. Follow up Visit:No Follow-up on file.  Postpartum contraception: IUD Mirena likely at 6 weeks  Newborn Data: Live born female  Birth Weight: 7 lb 3 oz (3260 g) APGAR: 8, 9  Baby Feeding: Breast Disposition:home with mother   10/10/2015 Diamond Delacruz, Diamond Delacruz, CNM

## 2015-10-10 NOTE — Discharge Instructions (Signed)
Postpartum Care After Cesarean Delivery °After you deliver your newborn (postpartum period), the usual stay in the hospital is 24-72 hours. If there were problems with your labor or delivery, or if you have other medical problems, you might be in the hospital longer.  °While you are in the hospital, you will receive help and instructions on how to care for yourself and your newborn during the postpartum period.  °While you are in the hospital: °· It is normal for you to have pain or discomfort from the incision in your abdomen. Be sure to tell your nurses when you are having pain, where the pain is located, and what makes the pain worse. °· If you are breastfeeding, you may feel uncomfortable contractions of your uterus for a couple of weeks. This is normal. The contractions help your uterus get back to normal size. °· It is normal to have some bleeding after delivery. °¨ For the first 1-3 days after delivery, the flow is red and the amount may be similar to a period. °¨ It is common for the flow to start and stop. °¨ In the first few days, you may pass some small clots. Let your nurses know if you begin to pass large clots or your flow increases. °¨ Do not  flush blood clots down the toilet before having the nurse look at them. °¨ During the next 3-10 days after delivery, your flow should become more watery and pink or brown-tinged in color. °¨ Ten to fourteen days after delivery, your flow should be a small amount of yellowish-white discharge. °¨ The amount of your flow will decrease over the first few weeks after delivery. Your flow may stop in 6-8 weeks. Most women have had their flow stop by 12 weeks after delivery. °· You should change your sanitary pads frequently. °· Wash your hands thoroughly with soap and water for at least 20 seconds after changing pads, using the toilet, or before holding or feeding your newborn. °· Your intravenous (IV) tubing will be removed when you are drinking enough fluids. °· The  urine drainage tube (urinary catheter) that was inserted before delivery may be removed within 6-8 hours after delivery or when feeling returns to your legs. You should feel like you need to empty your bladder within the first 6-8 hours after the catheter has been removed. °· In case you become weak, lightheaded, or faint, call your nurse before you get out of bed for the first time and before you take a shower for the first time. °· Within the first few days after delivery, your breasts may begin to feel tender and full. This is called engorgement. Breast tenderness usually goes away within 48-72 hours after engorgement occurs. You may also notice milk leaking from your breasts. If you are not breastfeeding, do not stimulate your breasts. Breast stimulation can make your breasts produce more milk. °· Spending as much time as possible with your newborn is very important. During this time, you and your newborn can feel close and get to know each other. Having your newborn stay in your room (rooming in) will help to strengthen the bond with your newborn. It will give you time to get to know your newborn and become comfortable caring for your newborn. °· Your hormones change after delivery. Sometimes the hormone changes can temporarily cause you to feel sad or tearful. These feelings should not last more than a few days. If these feelings last longer than that, you should talk to your   caregiver.  If desired, talk to your caregiver about methods of family planning or contraception.  Talk to your caregiver about immunizations. Your caregiver may want you to have the following immunizations before leaving the hospital:  Tetanus, diphtheria, and pertussis (Tdap) or tetanus and diphtheria (Td) immunization. It is very important that you and your family (including grandparents) or others caring for your newborn are up-to-date with the Tdap or Td immunizations. The Tdap or Td immunization can help protect your newborn  from getting ill.  Rubella immunization.  Varicella (chickenpox) immunization.  Influenza immunization. You should receive this annual immunization if you did not receive the immunization during your pregnancy.   This information is not intended to replace advice given to you by your health care provider. Make sure you discuss any questions you have with your health care provider.   Document Released: 06/16/2012 Document Reviewed: 06/16/2012  Levonorgestrel intrauterine device (IUD) What is this medicine? LEVONORGESTREL IUD (LEE voe nor jes trel) is a contraceptive (birth control) device. The device is placed inside the uterus by a healthcare professional. It is used to prevent pregnancy and can also be used to treat heavy bleeding that occurs during your period. Depending on the device, it can be used for 3 to 5 years. This medicine may be used for other purposes; ask your health care provider or pharmacist if you have questions. What should I tell my health care provider before I take this medicine? They need to know if you have any of these conditions: -abnormal Pap smear -cancer of the breast, uterus, or cervix -diabetes -endometritis -genital or pelvic infection now or in the past -have more than one sexual partner or your partner has more than one partner -heart disease -history of an ectopic or tubal pregnancy -immune system problems -IUD in place -liver disease or tumor -problems with blood clots or take blood-thinners -use intravenous drugs -uterus of unusual shape -vaginal bleeding that has not been explained -an unusual or allergic reaction to levonorgestrel, other hormones, silicone, or polyethylene, medicines, foods, dyes, or preservatives -pregnant or trying to get pregnant -breast-feeding How should I use this medicine? This device is placed inside the uterus by a health care professional. Talk to your pediatrician regarding the use of this medicine in children.  Special care may be needed. Overdosage: If you think you have taken too much of this medicine contact a poison control center or emergency room at once. NOTE: This medicine is only for you. Do not share this medicine with others. What if I miss a dose? This does not apply. What may interact with this medicine? Do not take this medicine with any of the following medications: -amprenavir -bosentan -fosamprenavir This medicine may also interact with the following medications: -aprepitant -barbiturate medicines for inducing sleep or treating seizures -bexarotene -griseofulvin -medicines to treat seizures like carbamazepine, ethotoin, felbamate, oxcarbazepine, phenytoin, topiramate -modafinil -pioglitazone -rifabutin -rifampin -rifapentine -some medicines to treat HIV infection like atazanavir, indinavir, lopinavir, nelfinavir, tipranavir, ritonavir -St. John's wort -warfarin This list may not describe all possible interactions. Give your health care provider a list of all the medicines, herbs, non-prescription drugs, or dietary supplements you use. Also tell them if you smoke, drink alcohol, or use illegal drugs. Some items may interact with your medicine. What should I watch for while using this medicine? Visit your doctor or health care professional for regular check ups. See your doctor if you or your partner has sexual contact with others, becomes HIV positive, or gets a  sexual transmitted disease. This product does not protect you against HIV infection (AIDS) or other sexually transmitted diseases. You can check the placement of the IUD yourself by reaching up to the top of your vagina with clean fingers to feel the threads. Do not pull on the threads. It is a good habit to check placement after each menstrual period. Call your doctor right away if you feel more of the IUD than just the threads or if you cannot feel the threads at all. The IUD may come out by itself. You may become  pregnant if the device comes out. If you notice that the IUD has come out use a backup birth control method like condoms and call your health care provider. Using tampons will not change the position of the IUD and are okay to use during your period. What side effects may I notice from receiving this medicine? Side effects that you should report to your doctor or health care professional as soon as possible: -allergic reactions like skin rash, itching or hives, swelling of the face, lips, or tongue -fever, flu-like symptoms -genital sores -high blood pressure -no menstrual period for 6 weeks during use -pain, swelling, warmth in the leg -pelvic pain or tenderness -severe or sudden headache -signs of pregnancy -stomach cramping -sudden shortness of breath -trouble with balance, talking, or walking -unusual vaginal bleeding, discharge -yellowing of the eyes or skin Side effects that usually do not require medical attention (report to your doctor or health care professional if they continue or are bothersome): -acne -breast pain -change in sex drive or performance -changes in weight -cramping, dizziness, or faintness while the device is being inserted -headache -irregular menstrual bleeding within first 3 to 6 months of use -nausea This list may not describe all possible side effects. Call your doctor for medical advice about side effects. You may report side effects to FDA at 1-800-FDA-1088. Where should I keep my medicine? This does not apply. NOTE: This sheet is a summary. It may not cover all possible information. If you have questions about this medicine, talk to your doctor, pharmacist, or health care provider.    2016, Elsevier/Gold Standard. (2011-10-23 13:54:04)  Elsevier Interactive Patient Education [employer]

## 2015-10-10 NOTE — Lactation Note (Signed)
This note was copied from the chart of Diamond Delacruz. Lactation Consultation Note  Patient Name: Diamond Delacruz ZOXWR'UToday's Date: 10/10/2015 Reason for consult: Follow-up assessment  Baby is 58 hours old , @ 4 % weight loss, Bili check at 48 hours old 3.3. Baby has been consistently breast feeding 15 -45 mins . Latch scores 7-9 , Voids 5 , stools 5 . And at consult had a mec smear.  Baby awake and hungry, LC assisted mom with latch with positioning and depth.  LC reviewed basics of latching - steps - and stressed the importance of of waiting for  The baby to open wide before latch . Mom mentioned she is having some nipple tenderness Due to baby cluster feeding last night and breast are feeling heavier.  Baby latched and LC showed mom how to flip upper lip to a flanged position for improved depth.  Mom reported latch was more comfortable.  Sore nipple and engorgement prevention and tx reviewed , mom instructed on the use comfort gels.  Per mom has a Breast pump at home.  Mother informed of post-discharge support and given phone number to the lactation department, including  services for phone call assistance; out-patient appointments; and breastfeeding support group. List of other  breastfeeding resources in the community given in the handout. Encouraged mother to call for problems or  concerns related to breastfeeding.   Maternal Data Has patient been taught Hand Expression?: Yes  Feeding Feeding Type: Breast Fed Length of feed:  (still feeding at 7 mins )  LATCH Score/Interventions Latch: Grasps breast easily, tongue down, lips flanged, rhythmical sucking. Intervention(s): Adjust position;Assist with latch;Breast massage;Breast compression  Audible Swallowing: Spontaneous and intermittent  Type of Nipple: Everted at rest and after stimulation  Comfort (Breast/Nipple): Soft / non-tender     Hold (Positioning): No assistance needed to correctly position infant at  breast. Intervention(s): Breastfeeding basics reviewed;Support Pillows;Position options;Skin to skin  LATCH Score: 10  Lactation Tools Discussed/Used     Consult Status Consult Status: Complete Date: 10/10/15 Follow-up type: In-patient    Kathrin Greathouseorio, Bobbyjoe Pabst Ann 10/10/2015, 9:48 AM
